# Patient Record
Sex: Male | Born: 1990 | Race: White | Hispanic: No | Marital: Single | Smoking: Current every day smoker
Health system: Southern US, Community
[De-identification: ages and names within clinical notes are randomized; demographics above are authoritative.]

## PROBLEM LIST (undated history)

## (undated) DIAGNOSIS — K219 Gastro-esophageal reflux disease without esophagitis: Secondary | ICD-10-CM

## (undated) DIAGNOSIS — I10 Essential (primary) hypertension: Secondary | ICD-10-CM

## (undated) DIAGNOSIS — Z87442 Personal history of urinary calculi: Secondary | ICD-10-CM

## (undated) HISTORY — PX: APPENDECTOMY: SHX54

---
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
## 2009-02-19 ENCOUNTER — Emergency Department (HOSPITAL_COMMUNITY): Admission: EM | Admit: 2009-02-19 | Discharge: 2009-02-19 | Payer: Self-pay | Admitting: Emergency Medicine

## 2009-05-16 ENCOUNTER — Emergency Department (HOSPITAL_COMMUNITY): Admission: EM | Admit: 2009-05-16 | Discharge: 2009-05-16 | Payer: Self-pay | Admitting: Emergency Medicine

## 2010-12-02 ENCOUNTER — Emergency Department (HOSPITAL_COMMUNITY)
Admission: EM | Admit: 2010-12-02 | Discharge: 2010-12-02 | Disposition: A | Discharge status: Home / Self Care | Payer: Self-pay | Attending: Emergency Medicine | Admitting: Emergency Medicine

## 2010-12-02 ENCOUNTER — Emergency Department (HOSPITAL_COMMUNITY): Payer: Self-pay

## 2010-12-02 DIAGNOSIS — M79609 Pain in unspecified limb: Secondary | ICD-10-CM | POA: Insufficient documentation

## 2010-12-02 DIAGNOSIS — L84 Corns and callosities: Secondary | ICD-10-CM | POA: Insufficient documentation

## 2010-12-02 DIAGNOSIS — R197 Diarrhea, unspecified: Secondary | ICD-10-CM | POA: Insufficient documentation

## 2010-12-02 DIAGNOSIS — E669 Obesity, unspecified: Secondary | ICD-10-CM | POA: Insufficient documentation

## 2010-12-02 DIAGNOSIS — J45909 Unspecified asthma, uncomplicated: Secondary | ICD-10-CM | POA: Insufficient documentation

## 2010-12-02 DIAGNOSIS — R112 Nausea with vomiting, unspecified: Secondary | ICD-10-CM | POA: Insufficient documentation

## 2010-12-02 DIAGNOSIS — M722 Plantar fascial fibromatosis: Secondary | ICD-10-CM | POA: Insufficient documentation

## 2010-12-02 LAB — URINALYSIS, ROUTINE W REFLEX MICROSCOPIC
Bilirubin Urine: NEGATIVE
Hgb urine dipstick: NEGATIVE
Ketones, ur: NEGATIVE mg/dL
Protein, ur: NEGATIVE mg/dL
Urobilinogen, UA: 0.2 mg/dL (ref 0.0–1.0)

## 2010-12-03 LAB — DIFFERENTIAL
Basophils Absolute: 0 10*3/uL (ref 0.0–0.1)
Basophils Relative: 0 % (ref 0–1)
Eosinophils Absolute: 0.2 10*3/uL (ref 0.0–0.7)
Monocytes Absolute: 1.5 10*3/uL — ABNORMAL HIGH (ref 0.1–1.0)
Neutro Abs: 6.9 10*3/uL (ref 1.7–7.7)
Neutrophils Relative %: 55 % (ref 43–77)

## 2010-12-03 LAB — BASIC METABOLIC PANEL
CO2: 26 mEq/L (ref 19–32)
Calcium: 9.1 mg/dL (ref 8.4–10.5)
Creatinine, Ser: 0.83 mg/dL (ref 0.4–1.5)
Glucose, Bld: 99 mg/dL (ref 70–99)

## 2010-12-03 LAB — CBC
MCHC: 35.5 g/dL (ref 30.0–36.0)
MCV: 89.7 fL (ref 78.0–100.0)
RDW: 12.5 % (ref 11.5–15.5)

## 2010-12-03 LAB — POCT CARDIAC MARKERS
CKMB, poc: <1 ng/mL — ABNORMAL LOW (ref 1.0–8.0)
Myoglobin, poc: 33.6 ng/mL (ref 12–200)

## 2012-01-22 ENCOUNTER — Emergency Department (HOSPITAL_COMMUNITY)
Admission: EM | Admit: 2012-01-22 | Discharge: 2012-01-22 | Disposition: A | Discharge status: Home / Self Care | Payer: Self-pay | Attending: Emergency Medicine | Admitting: Emergency Medicine

## 2012-01-22 ENCOUNTER — Encounter (HOSPITAL_COMMUNITY): Payer: Self-pay | Admitting: Emergency Medicine

## 2012-01-22 DIAGNOSIS — K0889 Other specified disorders of teeth and supporting structures: Secondary | ICD-10-CM

## 2012-01-22 DIAGNOSIS — K029 Dental caries, unspecified: Secondary | ICD-10-CM | POA: Insufficient documentation

## 2012-01-22 DIAGNOSIS — F17200 Nicotine dependence, unspecified, uncomplicated: Secondary | ICD-10-CM | POA: Insufficient documentation

## 2012-01-22 DIAGNOSIS — K089 Disorder of teeth and supporting structures, unspecified: Secondary | ICD-10-CM | POA: Insufficient documentation

## 2012-01-22 MED ORDER — HYDROCODONE-ACETAMINOPHEN 5-325 MG PO TABS: 1.0000 | ORAL_TABLET | ORAL | Status: AC | PRN

## 2012-01-22 MED ORDER — ONDANSETRON HCL 4 MG PO TABS
4.0000 mg | ORAL_TABLET | Freq: Once | ORAL | Status: AC
  Administered 2012-01-22: 4 mg via ORAL
  Filled 2012-01-22: qty 1

## 2012-01-22 MED ORDER — MELOXICAM 7.5 MG PO TABS: ORAL_TABLET | ORAL | Status: DC

## 2012-01-22 MED ORDER — PENICILLIN V POTASSIUM 250 MG PO TABS
500.0000 mg | ORAL_TABLET | Freq: Once | ORAL | Status: AC
  Administered 2012-01-22: 500 mg via ORAL
  Filled 2012-01-22: qty 2

## 2012-01-22 MED ORDER — IBUPROFEN 800 MG PO TABS
800.0000 mg | ORAL_TABLET | Freq: Once | ORAL | Status: AC
  Administered 2012-01-22: 800 mg via ORAL
  Filled 2012-01-22: qty 1

## 2012-01-22 MED ORDER — PENICILLIN V POTASSIUM 500 MG PO TABS: ORAL_TABLET | ORAL | Status: DC

## 2012-01-22 NOTE — ED Notes (Signed)
Pt c/o rt upper mouth pain x 3 days.

## 2012-01-22 NOTE — ED Provider Notes (Signed)
Medical screening examination/treatment/procedure(s) were performed by non-physician practitioner and as supervising physician I was immediately available for consultation/collaboration.  Juliet Rude. Rubin Payor, MD 01/22/12 [phone]

## 2012-01-22 NOTE — ED Provider Notes (Signed)
History     CSN: [id number]  Arrival date & time 01/22/12  1017   None     Chief Complaint  Patient presents with  . Dental Pain    (Consider location/radiation/quality/duration/timing/severity/associated sxs/prior treatment) Patient is a 21 y.o. male presenting with tooth pain. The history is provided by the patient.  Dental PainThe primary symptoms include mouth pain. Primary symptoms do not include shortness of breath or cough. The symptoms began 3 to 5 days ago. The symptoms are worsening. The symptoms occur constantly.  Additional symptoms include: gum swelling and jaw pain. Additional symptoms do not include: nosebleeds. Medical issues include: smoking.    History reviewed. No pertinent past medical history.  Past Surgical History  Procedure Date  . Appendectomy     History reviewed. No pertinent family history.  History  Substance Use Topics  . Smoking status: Current Everyday Smoker    Types: Cigarettes  . Smokeless tobacco: Not on file  . Alcohol Use: No      Review of Systems  Constitutional: Negative for activity change.       All ROS Neg except as noted in HPI  HENT: Positive for dental problem. Negative for nosebleeds and neck pain.   Eyes: Negative for photophobia and discharge.  Respiratory: Negative for cough, shortness of breath and wheezing.   Cardiovascular: Negative for chest pain and palpitations.  Gastrointestinal: Negative for abdominal pain and blood in stool.  Genitourinary: Negative for dysuria, frequency and hematuria.  Musculoskeletal: Negative for back pain and arthralgias.  Skin: Negative.   Neurological: Negative for dizziness, seizures and speech difficulty.  Psychiatric/Behavioral: Negative for hallucinations and confusion.    Allergies  Review of patient's allergies indicates not on file.  Home Medications  No current outpatient prescriptions on file.  BP 135/84  Pulse 84  Temp(Src) 97.7 F (36.5 C) (Oral)  Resp 17  Ht  5\' 9"  (1.753 m)  Wt 220 lb (99.791 kg)  BMI 32.49 kg/m2  SpO2 100%  Physical Exam  Nursing note and vitals reviewed. Constitutional: He is oriented to person, place, and time. He appears well-developed and well-nourished.  Non-toxic appearance.  HENT:  Head: Normocephalic.  Right Ear: Tympanic membrane and external ear normal.  Left Ear: Tympanic membrane and external ear normal.  Mouth/Throat: Dental caries present.       Multiple dental caries upper and lower with evidence of gum disease.  Eyes: EOM and lids are normal. Pupils are equal, round, and reactive to light.  Neck: Normal range of motion. Neck supple. Carotid bruit is not present.  Cardiovascular: Normal rate, regular rhythm, normal heart sounds, intact distal pulses and normal pulses.   Pulmonary/Chest: Breath sounds normal. No respiratory distress.  Abdominal: Soft. Bowel sounds are normal. There is no tenderness. There is no guarding.  Musculoskeletal: Normal range of motion.  Lymphadenopathy:       Head (right side): No submandibular adenopathy present.       Head (left side): No submandibular adenopathy present.    He has no cervical adenopathy.  Neurological: He is alert and oriented to person, place, and time. He has normal strength. No cranial nerve deficit or sensory deficit.  Skin: Skin is warm and dry.  Psychiatric: He has a normal mood and affect. His speech is normal.    ED Course  Procedures (including critical care time)  Labs Reviewed - No data to display No results found.   No diagnosis found.    MDM  I have reviewed nursing  notes, vital signs, and all appropriate lab and imaging results for this patient. Pt is [facility]. Waiting for paper work to clear.       Kathie Dike, [place] 01/22/12 1131

## 2012-01-22 NOTE — Discharge Instructions (Signed)
Dental Pain  A tooth ache may be caused by cavities (tooth decay). Cavities expose the nerve of the tooth to air and hot or cold temperatures. It may come from an infection or abscess (also called a boil or furuncle) around your tooth. It is also often caused by dental caries (tooth decay). This causes the pain you are having.  DIAGNOSIS   Your caregiver can diagnose this problem by exam.  TREATMENT   · If caused by an infection, it may be treated with medications which kill germs (antibiotics) and pain medications as prescribed by your caregiver. Take medications as directed.  · Only take over-the-counter or prescription medicines for pain, discomfort, or fever as directed by your caregiver.  · Whether the tooth ache today is caused by infection or dental disease, you should see your dentist as soon as possible for further care.  SEEK MEDICAL CARE IF:  The exam and treatment you received today has been provided on an emergency basis only. This is not a substitute for complete medical or dental care. If your problem worsens or new problems (symptoms) appear, and you are unable to meet with your dentist, call or return to this location.  SEEK IMMEDIATE MEDICAL CARE IF:   · You have a fever.  · You develop redness and swelling of your face, jaw, or neck.  · You are unable to open your mouth.  · You have severe pain uncontrolled by pain medicine.  MAKE SURE YOU:   · Understand these instructions.  · Will watch your condition.  · Will get help right away if you are not doing well or get worse.  Document Released: 08/12/2005 Document Revised: 08/01/2011 Document Reviewed: 03/30/2008  ExitCare® Patient Information ©2012 [employer].

## 2013-11-29 ENCOUNTER — Encounter (HOSPITAL_COMMUNITY): Payer: Self-pay | Admitting: Emergency Medicine

## 2013-11-29 ENCOUNTER — Emergency Department (HOSPITAL_COMMUNITY)
Admission: EM | Admit: 2013-11-29 | Discharge: 2013-11-29 | Disposition: A | Discharge status: Home / Self Care | Payer: Medicaid Other | Attending: Emergency Medicine | Admitting: Emergency Medicine

## 2013-11-29 DIAGNOSIS — M5430 Sciatica, unspecified side: Secondary | ICD-10-CM | POA: Insufficient documentation

## 2013-11-29 DIAGNOSIS — F17200 Nicotine dependence, unspecified, uncomplicated: Secondary | ICD-10-CM | POA: Insufficient documentation

## 2013-11-29 DIAGNOSIS — M549 Dorsalgia, unspecified: Secondary | ICD-10-CM | POA: Diagnosis present

## 2013-11-29 DIAGNOSIS — G8929 Other chronic pain: Secondary | ICD-10-CM | POA: Diagnosis not present

## 2013-11-29 MED ORDER — HYDROMORPHONE HCL PF 1 MG/ML IJ SOLN
1.0000 mg | Freq: Once | INTRAMUSCULAR | Status: AC
  Administered 2013-11-29: 1 mg via INTRAMUSCULAR
  Filled 2013-11-29: qty 1

## 2013-11-29 MED ORDER — OXYCODONE-ACETAMINOPHEN 5-325 MG PO TABS
1.0000 | ORAL_TABLET | Freq: Once | ORAL | Status: AC
  Administered 2013-11-29: 2 via ORAL
  Filled 2013-11-29: qty 2

## 2013-11-29 MED ORDER — OXYCODONE-ACETAMINOPHEN 10-325 MG PO TABS: 1.0000 | ORAL_TABLET | Freq: Four times a day (QID) | ORAL | Status: DC | PRN

## 2013-11-29 MED ORDER — PREDNISONE 10 MG PO TABS: 20.0000 mg | ORAL_TABLET | Freq: Every day | ORAL | Status: DC

## 2013-11-29 NOTE — ED Notes (Signed)
Measured urine in patients bladder with bladder scanner. 32ml residual left.

## 2013-11-29 NOTE — ED Notes (Signed)
Called ED case manager for patient assistance with follow up care.

## 2013-11-29 NOTE — ED Provider Notes (Signed)
CSN: [id number]     Arrival date & time 11/29/13  1305 History  This chart was scribed for non-physician practitioner, Felicie Morn, NP-C working with Gavin Pound. Oletta Lamas, MD by Luisa Dago, ED scribe. This patient was seen in room TR04C/TR04C and the patient's care was started at 4:28 PM.    Chief Complaint  Patient presents with  . Back Pain   The history is provided by the patient and a parent. No language interpreter was used.   HPI Comments: Daniel Stark is a 23 y.o. male who presents to the Emergency Department complaining of chronic back pain since October. Pt states that he has a history of a ruptured disk. He states that his last MRI was done at [facility]. Mother states that the pt has been seen by Dr. Jordan Likes who told the pt that he needs back surgery in order to alleviate his symptoms. However, pt states that due to insurance lapse he is unable to undergo the surgery. He is currently trying to apply for disability but he was told that he does not qualify. Mother states that pt is currently out of a job because of his condition, he is also unable to drive. Pt states that at home he was to put pillows on the chair to make sitting more comfortable. She reports that the pt has had one episode of bladder incontinence. Pt was seen by a chiropractor for 8 weeks with minimal to no relief. Pt Denies any numbness, abdominal pain, nausea, emesis,     History reviewed. No pertinent past medical history. Past Surgical History  Procedure Laterality Date  . Appendectomy     History reviewed. No pertinent family history. History  Substance Use Topics  . Smoking status: Current Every Day Smoker    Types: Cigarettes  . Smokeless tobacco: Not on file  . Alcohol Use: No    Review of Systems  Constitutional: Negative for fever, chills and diaphoresis.  HENT: Negative for congestion.   Respiratory: Negative for cough and shortness of breath.   Cardiovascular: Negative for chest pain.   Gastrointestinal: Negative for nausea, vomiting and abdominal pain.  Musculoskeletal: Positive for back pain.  Neurological: Negative for numbness.  All other systems reviewed and are negative.   Allergies  Review of patient's allergies indicates no known allergies.  Home Medications   Current Outpatient Rx  Name  Route  Sig  Dispense  Refill  . ibuprofen (ADVIL,MOTRIN) 200 MG tablet   Oral   Take 800 mg by mouth every 6 (six) hours as needed. For dental pain         . oxyCODONE-acetaminophen (PERCOCET) 10-325 MG per tablet   Oral   Take 1 tablet by mouth every 4 (four) hours as needed for pain.          BP 133/84  Pulse 80  Temp(Src) 97.9 F (36.6 C) (Oral)  Resp 20  Ht 5\' 9"  (1.753 m)  Wt 250 lb (113.399 kg)  BMI 36.90 kg/m2  SpO2 98%  Physical Exam  Nursing note and vitals reviewed. Constitutional: He is oriented to person, place, and time. He appears well-developed and well-nourished. No distress.  HENT:  Head: Normocephalic and atraumatic.  Eyes: EOM are normal.  Neck: Neck supple. No tracheal deviation present.  Cardiovascular: Normal rate.   Pulmonary/Chest: Effort normal. No respiratory distress.  Musculoskeletal: Normal range of motion. He exhibits tenderness.  Tenderness to Midpoint of thoracic spine to the lower back.  Neurological: He is alert and  oriented to person, place, and time. He displays abnormal reflex. Coordination normal.  Reflex Scores:      Patellar reflexes are 1+ on the right side and 1+ on the left side. Weakness to lower extremities. Right greater than left.   Skin: Skin is warm and dry.  Psychiatric: He has a normal mood and affect. His behavior is normal.    ED Course  Procedures (including critical care time)  DIAGNOSTIC STUDIES: Oxygen Saturation is 98% on RA, normal by my interpretation.    COORDINATION OF CARE: 4:38 PM-Discussed treatment plan which includes review of pt MRI and prescription of pain medication with pt  at bedside and pt agreed to plan.   Labs Review Labs Reviewed - No data to display Imaging Review No results found.   EKG Interpretation None     MRI images reviewed.   Good anal sphincter tone.  Doubt cauda equina. Add short course of steroid to pain management. Case manager requested to assist patient find a provider for ongoing pain management. Return precautions discussed. MDM   Final diagnoses:  None    Chronic back pain.  Herniated disk L4/5.  I personally performed the services described in this documentation, which was scribed in my presence. The recorded information has been reviewed and is accurate.  Jimmye Normanavid John Goel, NP 11/30/13 [phone]

## 2013-11-29 NOTE — ED Notes (Signed)
Patient given urnal with instructions.

## 2013-11-29 NOTE — ED Notes (Signed)
Left information for case manager to follow up with patient tomorrow.

## 2013-11-29 NOTE — ED Notes (Addendum)
Back pain since since oct  Has had MRI and seen [facility] has no insurance  Need pain meds, last  rx was 1 week ago told dr all issues last week, wants surgery

## 2013-11-29 NOTE — Progress Notes (Signed)
   CARE MANAGEMENT ED NOTE 11/29/2013  Patient:  Daniel Stark,Daniel Stark   Account Number:  [id number]  Date Initiated:  11/29/2013  Documentation initiated by:  Radford PaxFERRERO,Maricella Filyaw  Subjective/Objective Assessment:   patient presents to Ed with chronic back pain     Subjective/Objective Assessment Detail:     Action/Plan:   Action/Plan Detail:   Anticipated DC Date:       Status Recommendation to Physician:   Result of Recommendation:    Other ED Services  Consult Working Plan    DC Planning Services  Other  PCP issues    Choice offered to / List presented to:            Status of service:  Completed, signed off  ED Comments:   ED Comments Detail:  [facility] regarding physician follow up for back pain at 2014pm. [facility] returned phone call at 2050pm. Awaiting call back.

## 2013-12-01 NOTE — Progress Notes (Signed)
Late entry for 12/01/13 1450 [facility] ED AM CM received a call from [facility] office when pt called [facility]. CM spoke with pt who [facility] and states he was awaiting a f/u call for pcp appointment CM reviewed EPIC to see pt address [place] in [place] Cm inquired if [place] services [place] and pt stated "no"  [place]. Pt reports having made attempts to reach [facility] at [address] without response. CM discussed with him that she would make an attempt to reach staff at wellness center and return a call to him. CM verified contact information in EPIC. Correct home & mobile number is [phone] CM consulted with [facility] staff who confirms no [facility] program [place] [facility] [place] [facility] Cm sent referral email to [employer]com Levels[employer] Rivera at [facility] to f/u on 12/02/13 with pt and [facility]

## 2013-12-02 NOTE — ED Provider Notes (Signed)
Medical screening examination/treatment/procedure(s) were performed by non-physician practitioner and as supervising physician I was immediately available for consultation/collaboration.   Hennesy Sobalvarro Y. Pharaoh Pio, MD 12/02/13 1530 

## 2013-12-02 NOTE — Progress Notes (Signed)
12/02/13 Clinton GallantKimberly L Terance Pomplun, RN, CCM [phone] ED Cm received a voice message from [facility] C[facility], Aundra MilletMegan that pt called [facility] office about f/u appointment.  No return email from [employer] Rivera.  No calls from Wellness center 1253 ED CM left a voice message at [phone] to request assist with appointment and return call. 1258 CM spoke with pt and allowed him opportunity to ventilate his feelings and ask questions about what he could do to get the assist he wants related to MD care and disability process. Informed him that Cm has not received return call from Wellness center for his appointment. Reports he has applied for medicaid once & denied. Reports he is trying to appeal.  Aware he needs to return to apply at DSS. Cm discussed offered to call other self pay [facility] providers but pt states he does not have $50 co pay CM reviewed possible assist from local churches. Has been seen by Dr Jordan LikesPool, Dr Barth KirksMichaels, orthopedic provider, neurosurgery States informed by Neurosurgery they are unable to provide service until pt has funding even after speaking with finance/billing departments of these offices. Reports he has made attempt to apply for affordable care act in person but told he did not qualify ("don't make $11000 a year. I have not worked since last year" and medicaid would be of better assistance. Pt tearful during his conversation with ED CM.  Reports he has a "learning disability" and has his mother to also help him. Reviewed again with pt that he is not eligible for "[place] card" [place]. Pt states he has consulted with a lawyer and informed he has to obtain a pcp to have records to assist with his claim. Pt states difficulty with transportation also.  CM provided pt with [facility] [address] [place] [phone] to attempt to get pt to pcp services closer to [place] although he states he feels he receives better care in [place] when  he can get someone to drive him. Pt reports to be doing his best to navigate through his issues Cm encouraged and empathized with him. Cm also discuss his meed to contact the facilities [facility], MRI center, Dr Jordan LikesPool, Dr Barth KirksMichaels) he has received care from since injury to obtain medical records Pt provided with [facility] medical record request # [id number] Pt offered Cm office number CM encouraged pt to attempt to be seen [facility] during walk in times of monday-thursdays 9-10:30 am He stated he would make attempt. Pt appreciative of Cm for allowing time for him to ventilate his feelings and "speaking with me"

## 2014-03-31 ENCOUNTER — Other Ambulatory Visit: Payer: Self-pay | Admitting: Neurosurgery

## 2014-04-07 ENCOUNTER — Encounter (HOSPITAL_COMMUNITY): Payer: Self-pay | Admitting: Pharmacy Technician

## 2014-04-09 NOTE — Pre-Procedure Instructions (Addendum)
Webb SilversmithRobert W Batchelder  04/09/2014   Your procedure is scheduled on:  August 25  [facility] at 08:00 AM.  Call this number if you have problems the morning of surgery: [phone]   Remember:   Do not eat food or drink liquids after midnight.   Take these medicines the morning of surgery with A SIP OF WATER: Percocet (if needed)   STOP/ Do not take Aspirin, Aleve, Naproxen, Advil, Ibuprofen, Motrin, Vitamins, Herbs, or Supplements starting today   Do not wear jewelry, make-up or nail polish.  Do not wear lotions, powders, or perfumes. You may wear deodorant.  Do not shave 48 hours prior to surgery. Men may shave face and neck.  Do not bring valuables to the hospital.  [facility] is not responsible for any belongings or valuables.               Contacts, dentures or bridgework may not be worn into surgery.  Leave suitcase in the car. After surgery it may be brought to your room.  For patients admitted to the hospital, discharge time is determined by your treatment team.               Patients discharged the day of surgery will not be allowed to drive home.  Name and phone number of your driver: Family/ Friend  Special Instructions: See [facility] Preparing For Surgery   Please read over the following fact sheets that you were given: Pain Booklet, Coughing and Deep Breathing and Surgical Site Infection Prevention

## 2014-04-11 ENCOUNTER — Encounter (HOSPITAL_COMMUNITY): Payer: Self-pay

## 2014-04-11 ENCOUNTER — Encounter (HOSPITAL_COMMUNITY)
Admission: RE | Admit: 2014-04-11 | Discharge: 2014-04-11 | Disposition: A | Discharge status: Home / Self Care | Payer: Medicaid Other | Source: Ambulatory Visit | Attending: Neurosurgery | Admitting: Neurosurgery

## 2014-04-11 ENCOUNTER — Other Ambulatory Visit (HOSPITAL_COMMUNITY): Payer: Medicaid Other

## 2014-04-11 DIAGNOSIS — Z01818 Encounter for other preprocedural examination: Secondary | ICD-10-CM | POA: Insufficient documentation

## 2014-04-11 DIAGNOSIS — M5126 Other intervertebral disc displacement, lumbar region: Secondary | ICD-10-CM | POA: Insufficient documentation

## 2014-04-11 DIAGNOSIS — E663 Overweight: Secondary | ICD-10-CM | POA: Insufficient documentation

## 2014-04-11 DIAGNOSIS — I10 Essential (primary) hypertension: Secondary | ICD-10-CM | POA: Diagnosis not present

## 2014-04-11 HISTORY — DX: Gastro-esophageal reflux disease without esophagitis: K21.9

## 2014-04-11 HISTORY — DX: Personal history of urinary calculi: Z87.442

## 2014-04-11 HISTORY — DX: Essential (primary) hypertension: I10

## 2014-04-11 LAB — BASIC METABOLIC PANEL
Anion gap: 14 (ref 5–15)
BUN: 9 mg/dL (ref 6–23)
CO2: 25 meq/L (ref 19–32)
Calcium: 9.5 mg/dL (ref 8.4–10.5)
Chloride: 100 mEq/L (ref 96–112)
Creatinine, Ser: 0.79 mg/dL (ref 0.50–1.35)
GFR calc Af Amer: >90 mL/min (ref >90)
Glucose, Bld: 95 mg/dL (ref 70–99)
POTASSIUM: 3.7 meq/L (ref 3.7–5.3)
SODIUM: 139 meq/L (ref 137–147)

## 2014-04-11 LAB — CBC WITH DIFFERENTIAL/PLATELET
Basophils Absolute: 0 10*3/uL (ref 0.0–0.1)
Basophils Relative: 0 % (ref 0–1)
EOS ABS: 0.3 10*3/uL (ref 0.0–0.7)
EOS PCT: 3 % (ref 0–5)
HCT: 39.7 % (ref 39.0–52.0)
Hemoglobin: 13.8 g/dL (ref 13.0–17.0)
Lymphocytes Relative: 30 % (ref 12–46)
Lymphs Abs: 2.8 10*3/uL (ref 0.7–4.0)
MCH: 31.2 pg (ref 26.0–34.0)
MCHC: 34.8 g/dL (ref 30.0–36.0)
MCV: 89.8 fL (ref 78.0–100.0)
MONOS PCT: 11 % (ref 3–12)
Monocytes Absolute: 1 10*3/uL (ref 0.1–1.0)
Neutro Abs: 5.3 10*3/uL (ref 1.7–7.7)
Neutrophils Relative %: 56 % (ref 43–77)
PLATELETS: 298 10*3/uL (ref 150–400)
RBC: 4.42 MIL/uL (ref 4.22–5.81)
RDW: 12.2 % (ref 11.5–15.5)
WBC: 9.4 10*3/uL (ref 4.0–10.5)

## 2014-04-11 LAB — SURGICAL PCR SCREEN
MRSA, PCR: NEGATIVE
Staphylococcus aureus: NEGATIVE

## 2014-04-18 MED ORDER — DEXTROSE 5 % IV SOLN
3.0000 g | INTRAVENOUS | Status: AC
  Administered 2014-04-19: 3 g via INTRAVENOUS
  Filled 2014-04-18: qty 3000

## 2014-04-18 MED ORDER — CEFAZOLIN SODIUM-DEXTROSE 2-3 GM-% IV SOLR: 2.0000 g | INTRAVENOUS | Status: DC

## 2014-04-18 MED ORDER — DEXAMETHASONE SODIUM PHOSPHATE 10 MG/ML IJ SOLN
10.0000 mg | INTRAMUSCULAR | Status: AC
  Administered 2014-04-19: 10 mg via INTRAVENOUS
  Filled 2014-04-18: qty 1

## 2014-04-19 ENCOUNTER — Ambulatory Visit (HOSPITAL_COMMUNITY): Payer: Medicaid Other

## 2014-04-19 ENCOUNTER — Ambulatory Visit (HOSPITAL_COMMUNITY): Payer: Medicaid Other | Admitting: Certified Registered Nurse Anesthetist

## 2014-04-19 ENCOUNTER — Encounter (HOSPITAL_COMMUNITY): Payer: Medicaid Other | Admitting: Certified Registered Nurse Anesthetist

## 2014-04-19 ENCOUNTER — Observation Stay (HOSPITAL_COMMUNITY)
Admission: RE | Admit: 2014-04-19 | Discharge: 2014-04-19 | Disposition: A | Discharge status: Home / Self Care | Payer: Medicaid Other | Source: Ambulatory Visit | Attending: Neurosurgery | Admitting: Neurosurgery

## 2014-04-19 ENCOUNTER — Encounter (HOSPITAL_COMMUNITY): Payer: Self-pay | Admitting: *Deleted

## 2014-04-19 ENCOUNTER — Encounter (HOSPITAL_COMMUNITY)
Admission: RE | Disposition: A | Discharge status: Home / Self Care | Payer: Self-pay | Source: Ambulatory Visit | Attending: Neurosurgery

## 2014-04-19 DIAGNOSIS — I10 Essential (primary) hypertension: Secondary | ICD-10-CM | POA: Insufficient documentation

## 2014-04-19 DIAGNOSIS — M5116 Intervertebral disc disorders with radiculopathy, lumbar region: Secondary | ICD-10-CM | POA: Diagnosis present

## 2014-04-19 DIAGNOSIS — F17200 Nicotine dependence, unspecified, uncomplicated: Secondary | ICD-10-CM | POA: Diagnosis not present

## 2014-04-19 DIAGNOSIS — M5126 Other intervertebral disc displacement, lumbar region: Secondary | ICD-10-CM | POA: Diagnosis present

## 2014-04-19 HISTORY — PX: LUMBAR LAMINECTOMY/DECOMPRESSION MICRODISCECTOMY: SHX5026

## 2014-04-19 SURGERY — LUMBAR LAMINECTOMY/DECOMPRESSION MICRODISCECTOMY 1 LEVEL
Anesthesia: General | Site: Back | Laterality: Left

## 2014-04-19 MED ORDER — ONDANSETRON HCL 4 MG/2ML IJ SOLN
INTRAMUSCULAR | Status: AC
  Filled 2014-04-19: qty 2

## 2014-04-19 MED ORDER — NEOSTIGMINE METHYLSULFATE 10 MG/10ML IV SOLN
INTRAVENOUS | Status: DC | PRN
  Administered 2014-04-19: 4 mg via INTRAVENOUS

## 2014-04-19 MED ORDER — THROMBIN 5000 UNITS EX SOLR
CUTANEOUS | Status: DC | PRN
  Administered 2014-04-19 (×2): 5000 [IU] via TOPICAL

## 2014-04-19 MED ORDER — SODIUM CHLORIDE 0.9 % IV SOLN: 250.0000 mL | INTRAVENOUS | Status: DC

## 2014-04-19 MED ORDER — BUPIVACAINE HCL (PF) 0.25 % IJ SOLN
INTRAMUSCULAR | Status: DC | PRN
  Administered 2014-04-19: 20 mL

## 2014-04-19 MED ORDER — HEMOSTATIC AGENTS (NO CHARGE) OPTIME
TOPICAL | Status: DC | PRN
  Administered 2014-04-19: 1 via TOPICAL

## 2014-04-19 MED ORDER — ROCURONIUM BROMIDE 50 MG/5ML IV SOLN
INTRAVENOUS | Status: AC
  Filled 2014-04-19: qty 1

## 2014-04-19 MED ORDER — ALUM & MAG HYDROXIDE-SIMETH 200-200-20 MG/5ML PO SUSP: 30.0000 mL | Freq: Four times a day (QID) | ORAL | Status: DC | PRN

## 2014-04-19 MED ORDER — LACTATED RINGERS IV SOLN
INTRAVENOUS | Status: DC | PRN
  Administered 2014-04-19 (×2): via INTRAVENOUS

## 2014-04-19 MED ORDER — PROMETHAZINE HCL 25 MG/ML IJ SOLN: 6.2500 mg | INTRAMUSCULAR | Status: DC | PRN

## 2014-04-19 MED ORDER — ACETAMINOPHEN 325 MG PO TABS: 650.0000 mg | ORAL_TABLET | ORAL | Status: DC | PRN

## 2014-04-19 MED ORDER — OXYCODONE-ACETAMINOPHEN 10-325 MG PO TABS: 1.0000 | ORAL_TABLET | ORAL | Status: AC | PRN

## 2014-04-19 MED ORDER — OXYCODONE HCL 5 MG PO TABS
ORAL_TABLET | ORAL | Status: AC
  Filled 2014-04-19: qty 1

## 2014-04-19 MED ORDER — FENTANYL CITRATE 0.05 MG/ML IJ SOLN
INTRAMUSCULAR | Status: AC
  Filled 2014-04-19: qty 5

## 2014-04-19 MED ORDER — KETOROLAC TROMETHAMINE 30 MG/ML IJ SOLN
INTRAMUSCULAR | Status: DC | PRN
  Administered 2014-04-19: 30 mg via INTRAVENOUS

## 2014-04-19 MED ORDER — OXYCODONE HCL 5 MG/5ML PO SOLN: 5.0000 mg | Freq: Once | ORAL | Status: AC | PRN

## 2014-04-19 MED ORDER — 0.9 % SODIUM CHLORIDE (POUR BTL) OPTIME
TOPICAL | Status: DC | PRN
  Administered 2014-04-19: 1000 mL

## 2014-04-19 MED ORDER — ALBUTEROL SULFATE HFA 108 (90 BASE) MCG/ACT IN AERS
INHALATION_SPRAY | RESPIRATORY_TRACT | Status: DC | PRN
  Administered 2014-04-19: 2 via RESPIRATORY_TRACT

## 2014-04-19 MED ORDER — ACETAMINOPHEN 650 MG RE SUPP: 650.0000 mg | RECTAL | Status: DC | PRN

## 2014-04-19 MED ORDER — GLYCOPYRROLATE 0.2 MG/ML IJ SOLN
INTRAMUSCULAR | Status: AC
  Filled 2014-04-19: qty 5

## 2014-04-19 MED ORDER — LACTATED RINGERS IV SOLN
INTRAVENOUS | Status: DC
  Administered 2014-04-19: 09:00:00 via INTRAVENOUS

## 2014-04-19 MED ORDER — NEOSTIGMINE METHYLSULFATE 10 MG/10ML IV SOLN
INTRAVENOUS | Status: AC
  Filled 2014-04-19: qty 2

## 2014-04-19 MED ORDER — KETOROLAC TROMETHAMINE 30 MG/ML IJ SOLN
INTRAMUSCULAR | Status: AC
  Filled 2014-04-19: qty 1

## 2014-04-19 MED ORDER — PHENYLEPHRINE HCL 10 MG/ML IJ SOLN
INTRAMUSCULAR | Status: DC | PRN
  Administered 2014-04-19: 80 ug via INTRAVENOUS

## 2014-04-19 MED ORDER — MIDAZOLAM HCL 2 MG/2ML IJ SOLN
INTRAMUSCULAR | Status: AC
  Filled 2014-04-19: qty 2

## 2014-04-19 MED ORDER — LIDOCAINE HCL (CARDIAC) 20 MG/ML IV SOLN
INTRAVENOUS | Status: AC
  Filled 2014-04-19: qty 5

## 2014-04-19 MED ORDER — SODIUM CHLORIDE 0.9 % IR SOLN
Status: DC | PRN
  Administered 2014-04-19: 10:00:00

## 2014-04-19 MED ORDER — PROPOFOL 10 MG/ML IV BOLUS
INTRAVENOUS | Status: AC
Start: 2014-04-19 — End: 2014-04-19
  Filled 2014-04-19: qty 20

## 2014-04-19 MED ORDER — PROPOFOL 10 MG/ML IV BOLUS
INTRAVENOUS | Status: AC
  Filled 2014-04-19: qty 20

## 2014-04-19 MED ORDER — ONDANSETRON HCL 4 MG/2ML IJ SOLN: 4.0000 mg | INTRAMUSCULAR | Status: DC | PRN

## 2014-04-19 MED ORDER — SODIUM CHLORIDE 0.9 % IJ SOLN: 3.0000 mL | Freq: Two times a day (BID) | INTRAMUSCULAR | Status: DC

## 2014-04-19 MED ORDER — CYCLOBENZAPRINE HCL 10 MG PO TABS
ORAL_TABLET | ORAL | Status: AC
  Administered 2014-04-19: 10 mg
  Filled 2014-04-19: qty 1

## 2014-04-19 MED ORDER — GLYCOPYRROLATE 0.2 MG/ML IJ SOLN
INTRAMUSCULAR | Status: DC | PRN
  Administered 2014-04-19: .8 mg via INTRAVENOUS

## 2014-04-19 MED ORDER — SENNA 8.6 MG PO TABS
1.0000 | ORAL_TABLET | Freq: Two times a day (BID) | ORAL | Status: DC
  Administered 2014-04-19: 8.6 mg via ORAL
  Filled 2014-04-19: qty 1

## 2014-04-19 MED ORDER — MENTHOL 3 MG MT LOZG: 1.0000 | LOZENGE | OROMUCOSAL | Status: DC | PRN

## 2014-04-19 MED ORDER — CYCLOBENZAPRINE HCL 10 MG PO TABS: 10.0000 mg | ORAL_TABLET | Freq: Three times a day (TID) | ORAL | Status: DC | PRN

## 2014-04-19 MED ORDER — HYDROCODONE-ACETAMINOPHEN 5-325 MG PO TABS: 1.0000 | ORAL_TABLET | ORAL | Status: DC | PRN

## 2014-04-19 MED ORDER — ROCURONIUM BROMIDE 50 MG/5ML IV SOLN
INTRAVENOUS | Status: AC
  Filled 2014-04-19: qty 2

## 2014-04-19 MED ORDER — SODIUM CHLORIDE 0.9 % IJ SOLN: 3.0000 mL | INTRAMUSCULAR | Status: DC | PRN

## 2014-04-19 MED ORDER — KETOROLAC TROMETHAMINE 30 MG/ML IJ SOLN: 30.0000 mg | Freq: Four times a day (QID) | INTRAMUSCULAR | Status: DC

## 2014-04-19 MED ORDER — OXYCODONE HCL 5 MG PO TABS
5.0000 mg | ORAL_TABLET | Freq: Once | ORAL | Status: AC | PRN
  Administered 2014-04-19: 5 mg via ORAL

## 2014-04-19 MED ORDER — PHENOL 1.4 % MT LIQD: 1.0000 | OROMUCOSAL | Status: DC | PRN

## 2014-04-19 MED ORDER — ARTIFICIAL TEARS OP OINT
TOPICAL_OINTMENT | OPHTHALMIC | Status: DC | PRN
  Administered 2014-04-19: 1 via OPHTHALMIC

## 2014-04-19 MED ORDER — ALBUTEROL SULFATE HFA 108 (90 BASE) MCG/ACT IN AERS
INHALATION_SPRAY | RESPIRATORY_TRACT | Status: AC
  Filled 2014-04-19: qty 6.7

## 2014-04-19 MED ORDER — HYDROMORPHONE HCL PF 1 MG/ML IJ SOLN
0.2500 mg | INTRAMUSCULAR | Status: DC | PRN
  Administered 2014-04-19 (×2): 0.5 mg via INTRAVENOUS

## 2014-04-19 MED ORDER — ROCURONIUM BROMIDE 100 MG/10ML IV SOLN
INTRAVENOUS | Status: DC | PRN
  Administered 2014-04-19: 50 mg via INTRAVENOUS

## 2014-04-19 MED ORDER — OXYCODONE-ACETAMINOPHEN 10-325 MG PO TABS: 1.0000 | ORAL_TABLET | ORAL | Status: DC | PRN

## 2014-04-19 MED ORDER — CEFAZOLIN SODIUM 1-5 GM-% IV SOLN
1.0000 g | Freq: Three times a day (TID) | INTRAVENOUS | Status: DC
  Filled 2014-04-19: qty 50

## 2014-04-19 MED ORDER — PROPOFOL 10 MG/ML IV BOLUS
INTRAVENOUS | Status: DC | PRN
  Administered 2014-04-19: 200 mg via INTRAVENOUS

## 2014-04-19 MED ORDER — ONDANSETRON HCL 4 MG/2ML IJ SOLN
INTRAMUSCULAR | Status: DC | PRN
  Administered 2014-04-19: 4 mg via INTRAVENOUS

## 2014-04-19 MED ORDER — LIDOCAINE HCL (CARDIAC) 20 MG/ML IV SOLN
INTRAVENOUS | Status: DC | PRN
  Administered 2014-04-19: 100 mg via INTRAVENOUS

## 2014-04-19 MED ORDER — OXYCODONE-ACETAMINOPHEN 5-325 MG PO TABS
1.0000 | ORAL_TABLET | ORAL | Status: DC | PRN
  Administered 2014-04-19: 2 via ORAL
  Filled 2014-04-19: qty 2

## 2014-04-19 MED ORDER — HYDROMORPHONE HCL PF 1 MG/ML IJ SOLN
0.5000 mg | INTRAMUSCULAR | Status: DC | PRN
Start: 2014-04-19 — End: 2014-04-19

## 2014-04-19 MED ORDER — CYCLOBENZAPRINE HCL 10 MG PO TABS: 10.0000 mg | ORAL_TABLET | Freq: Three times a day (TID) | ORAL | Status: AC | PRN

## 2014-04-19 MED ORDER — HYDROMORPHONE HCL PF 1 MG/ML IJ SOLN
INTRAMUSCULAR | Status: AC
  Filled 2014-04-19: qty 2

## 2014-04-19 SURGICAL SUPPLY — 54 items
BAG DECANTER FOR FLEXI CONT (MISCELLANEOUS) ×3 IMPLANT
BENZOIN TINCTURE PRP APPL 2/3 (GAUZE/BANDAGES/DRESSINGS) ×3 IMPLANT
BLADE SURG ROTATE 9660 (MISCELLANEOUS) IMPLANT
BRUSH SCRUB EZ PLAIN DRY (MISCELLANEOUS) ×3 IMPLANT
BUR CUTTER 7.0 ROUND (BURR) ×3 IMPLANT
CANISTER SUCT 3000ML (MISCELLANEOUS) ×3 IMPLANT
CLOSURE WOUND 1/2 X4 (GAUZE/BANDAGES/DRESSINGS) ×1
CONT SPEC 4OZ CLIKSEAL STRL BL (MISCELLANEOUS) ×3 IMPLANT
DECANTER SPIKE VIAL GLASS SM (MISCELLANEOUS) ×3 IMPLANT
DERMABOND ADHESIVE PROPEN (GAUZE/BANDAGES/DRESSINGS) ×2
DERMABOND ADVANCED (GAUZE/BANDAGES/DRESSINGS) ×2
DERMABOND ADVANCED .7 DNX12 (GAUZE/BANDAGES/DRESSINGS) ×1 IMPLANT
DERMABOND ADVANCED .7 DNX6 (GAUZE/BANDAGES/DRESSINGS) ×1 IMPLANT
DRAPE LAPAROTOMY 100X72X124 (DRAPES) ×3 IMPLANT
DRAPE MICROSCOPE LEICA (MISCELLANEOUS) ×3 IMPLANT
DRAPE POUCH INSTRU U-SHP 10X18 (DRAPES) ×3 IMPLANT
DRAPE PROXIMA HALF (DRAPES) IMPLANT
DRAPE SURG 17X23 STRL (DRAPES) ×6 IMPLANT
DRSG OPSITE POSTOP 3X4 (GAUZE/BANDAGES/DRESSINGS) ×3 IMPLANT
DURAPREP 26ML APPLICATOR (WOUND CARE) ×3 IMPLANT
ELECT REM PT RETURN 9FT ADLT (ELECTROSURGICAL) ×3
ELECTRODE REM PT RTRN 9FT ADLT (ELECTROSURGICAL) ×1 IMPLANT
GAUZE SPONGE 4X4 12PLY STRL (GAUZE/BANDAGES/DRESSINGS) ×3 IMPLANT
GAUZE SPONGE 4X4 16PLY XRAY LF (GAUZE/BANDAGES/DRESSINGS) IMPLANT
GLOVE BIO SURGEON STRL SZ7 (GLOVE) ×6 IMPLANT
GLOVE BIOGEL PI IND STRL 7.0 (GLOVE) ×1 IMPLANT
GLOVE BIOGEL PI INDICATOR 7.0 (GLOVE) ×2
GLOVE ECLIPSE 6.5 STRL STRAW (GLOVE) ×3 IMPLANT
GLOVE ECLIPSE 9.0 STRL (GLOVE) ×6 IMPLANT
GLOVE EXAM NITRILE LRG STRL (GLOVE) IMPLANT
GLOVE EXAM NITRILE MD LF STRL (GLOVE) IMPLANT
GLOVE EXAM NITRILE XL STR (GLOVE) IMPLANT
GLOVE EXAM NITRILE XS STR PU (GLOVE) IMPLANT
GOWN STRL REUS W/ TWL LRG LVL3 (GOWN DISPOSABLE) IMPLANT
GOWN STRL REUS W/ TWL XL LVL3 (GOWN DISPOSABLE) ×4 IMPLANT
GOWN STRL REUS W/TWL 2XL LVL3 (GOWN DISPOSABLE) IMPLANT
GOWN STRL REUS W/TWL LRG LVL3 (GOWN DISPOSABLE)
GOWN STRL REUS W/TWL XL LVL3 (GOWN DISPOSABLE) ×8
KIT BASIN OR (CUSTOM PROCEDURE TRAY) ×3 IMPLANT
KIT ROOM TURNOVER OR (KITS) ×3 IMPLANT
NEEDLE HYPO 22GX1.5 SAFETY (NEEDLE) ×3 IMPLANT
NEEDLE SPNL 22GX3.5 QUINCKE BK (NEEDLE) ×3 IMPLANT
NS IRRIG 1000ML POUR BTL (IV SOLUTION) ×3 IMPLANT
PACK LAMINECTOMY NEURO (CUSTOM PROCEDURE TRAY) ×3 IMPLANT
PAD ARMBOARD 7.5X6 YLW CONV (MISCELLANEOUS) ×9 IMPLANT
RUBBERBAND STERILE (MISCELLANEOUS) ×6 IMPLANT
SPONGE SURGIFOAM ABS GEL SZ50 (HEMOSTASIS) ×3 IMPLANT
STRIP CLOSURE SKIN 1/2X4 (GAUZE/BANDAGES/DRESSINGS) ×2 IMPLANT
SUT VIC AB 2-0 CT1 18 (SUTURE) ×3 IMPLANT
SUT VIC AB 3-0 SH 8-18 (SUTURE) ×3 IMPLANT
SYR 20ML ECCENTRIC (SYRINGE) ×3 IMPLANT
TOWEL OR 17X24 6PK STRL BLUE (TOWEL DISPOSABLE) ×3 IMPLANT
TOWEL OR 17X26 10 PK STRL BLUE (TOWEL DISPOSABLE) ×3 IMPLANT
WATER STERILE IRR 1000ML POUR (IV SOLUTION) ×3 IMPLANT

## 2014-04-19 NOTE — Op Note (Signed)
Date of procedure: 04/19/2014  Date of dictation: Same  Service: Neurosurgery  Preoperative diagnosis: Left L4-5 para central disc herniation with radiculopathy  Postoperative diagnosis: Same  Procedure Name: Left L4-5 laminotomy and microdiscectomy  Surgeon:Madisin Hasan A.Jeneva Schweizer, M.D.  Asst. Surgeon: Franky Macho  Anesthesia: General  Indication: 23 year old male with severe back and bilateral lower extremity pain left greater than right. Workup demonstrates evidence of a very large left-sided L4-5 disc herniation with severe compression the thecal sac and L5 nerve roots left greater than right. Patient's failed conservative management. Patient has transitional anatomy at the lumbosacral junction and this disc herniation has been described differently throughout the chart. He'll be referred to as L4-5 for this dictation. Alternately it is been referred to as L5-S1 and L4-S1.  Operative note: After induction anesthesia, patient positioned prone onto Wilson frame and appropriate padded. Lumbar region prepped and draped. Incision made overlying L4-5. Supper off dissection performed the left side. Retractor placed. X-ray taken. Level confirmed. Laminotomy performed using high-speed drill and Kerrison rongeurs to remove the inferior aspect of lamina of L4 medial aspect the L4-5 facet joint superior rim of the L5 lamina. Ligament flavum was elevated and resected so fashion using Kerrison rongeurs. Underlying thecal sac and L5 nerve or were notified. Microscope and brought into the field and used for microdissection of the spinal canal. Epidural venous plexus was quite related and cut. Thecal sac and L5 nerve root a mobilized. A large disc herniation was readily apparent. The annulus was then incised. Disc material including multiple very large fragments were removed using pituitary rongeurs. An aggressive discectomy was then performed. All elements the disc herniation appeared and resected. All loose or obvious he  jammed his her was removed and interspace. This point a very thorough discectomy been achieved. There was no evidence of injury to thecal sac and nerve roots. Wound is then irrigated out like solution. Gelfoam was placed topically for hemostasis. Microscope and retractor system were removed. Hemostasis of the muscle was achieved with electrocautery. Wounds and close in layers with Vicryl sutures. Steri-Strips sterile dressing were applied. No apparent complications. Patient tolerated the procedure well and he returns to the recovery room postop.

## 2014-04-19 NOTE — Anesthesia Preprocedure Evaluation (Addendum)
Anesthesia Evaluation  Patient identified by MRN, date of birth, ID band Patient awake    Reviewed: Allergy & Precautions, H&P , NPO status , Patient's Chart, lab work & pertinent test results  Airway Mallampati: II TM Distance: >3 FB Neck ROM: Full    Dental  (+) Poor Dentition, Dental Advisory Given   Pulmonary Current Smoker,    Pulmonary exam normal       Cardiovascular hypertension,     Neuro/Psych negative neurological ROS  negative psych ROS   GI/Hepatic Neg liver ROS, GERD-  Medicated,  Endo/Other  negative endocrine ROS  Renal/GU negative Renal ROS  negative genitourinary   Musculoskeletal negative musculoskeletal ROS (+)   Abdominal   Peds negative pediatric ROS (+)  Hematology negative hematology ROS (+)   Anesthesia Other Findings   Reproductive/Obstetrics negative OB ROS                          Anesthesia Physical Anesthesia Plan  ASA: II  Anesthesia Plan: General   Post-op Pain Management:    Induction: Intravenous  Airway Management Planned: Oral ETT  Additional Equipment:   Intra-op Plan:   Post-operative Plan: Extubation in OR  Informed Consent: I have reviewed the patients History and Physical, chart, labs and discussed the procedure including the risks, benefits and alternatives for the proposed anesthesia with the patient or authorized representative who has indicated his/her understanding and acceptance.   Dental advisory given and Dental Advisory Given  Plan Discussed with: CRNA, Anesthesiologist and Surgeon  Anesthesia Plan Comments:        Anesthesia Quick Evaluation

## 2014-04-19 NOTE — Anesthesia Postprocedure Evaluation (Signed)
Anesthesia Post Note  Patient: Daniel Stark  Procedure(s) Performed: Procedure(s) (LRB): LUMBAR LAMINECTOMY/DECOMPRESSION MICRODISCECTOMY LUMBAR LEVEL FIVE, SACRAL ONE (Left)  Anesthesia type: general  Patient location: PACU  Post pain: Pain level controlled  Post assessment: Patient's Cardiovascular Status Stable  Last Vitals:  Filed Vitals:   04/19/14 1145  BP: 110/68  Pulse: 81  Temp:   Resp: 17    Post vital signs: Reviewed and stable  Level of consciousness: sedated  Complications: No apparent anesthesia complications

## 2014-04-19 NOTE — Transfer of Care (Signed)
Immediate Anesthesia Transfer of Care Note  Patient: Daniel Stark  Procedure(s) Performed: Procedure(s): LUMBAR LAMINECTOMY/DECOMPRESSION MICRODISCECTOMY LUMBAR LEVEL FIVE, SACRAL ONE (Left)  Patient Location: PACU  Anesthesia Type:General  Level of Consciousness: awake, alert  and oriented  Airway & Oxygen Therapy: Patient Spontanous Breathing and Patient connected to face mask oxygen  Post-op Assessment: Report given to PACU RN, Post -op Vital signs reviewed and stable and Patient moving all extremities X 4  Post vital signs: Reviewed and stable  Complications: No apparent anesthesia complications

## 2014-04-19 NOTE — Brief Op Note (Signed)
04/19/2014  11:00 AM  PATIENT:  Daniel Stark  23 y.o. male  PRE-OPERATIVE DIAGNOSIS:  HNP  POST-OPERATIVE DIAGNOSIS:  HNP  PROCEDURE:  Procedure(s): LUMBAR LAMINECTOMY/DECOMPRESSION MICRODISCECTOMY LUMBAR LEVEL FIVE, SACRAL ONE (Left)  SURGEON:  Surgeon(s) and Role:    * Temple Pacini, MD - Primary    * Coletta Memos, MD - Assisting  PHYSICIAN ASSISTANT:   ASSISTANTS:    ANESTHESIA:   general  EBL:  Total I/O In: 1500 [I.V.:1500] Out: 100 [Blood:100]  BLOOD ADMINISTERED:none  DRAINS: none   LOCAL MEDICATIONS USED:  MARCAINE     SPECIMEN:  No Specimen  DISPOSITION OF SPECIMEN:  N/A  COUNTS:  YES  TOURNIQUET:  * No tourniquets in log *  DICTATION: .Dragon Dictation  PLAN OF CARE: Admit for overnight observation  PATIENT DISPOSITION:  PACU - hemodynamically stable.   Delay start of Pharmacological VTE agent (>24hrs) due to surgical blood loss or risk of bleeding: yes

## 2014-04-19 NOTE — H&P (Signed)
Daniel Stark is an 23 y.o. male.   Chief Complaint: Back pain HPI: 23 year old male with severe lumbar pain radiating into both lower extremities left greater than right. Workup demonstrates evidence of a large left paracentral disc herniation L4-5 with marked compression the thecal sac and nerve roots. Patient's failed conservative management and presents now for lumbar microdiscectomy in hopes of improving his symptoms.  Past Medical History  Diagnosis Date  . History of kidney stones   . GERD (gastroesophageal reflux disease)     occ  . Hypertension     blood pressure fluxuates up sometimes no meds    Past Surgical History  Procedure Laterality Date  . Appendectomy      child    History reviewed. No pertinent family history. Social History:  reports that he has been smoking Cigarettes.  He has a 9 pack-year smoking history. He does not have any smokeless tobacco history on file. He reports that he does not drink alcohol or use illicit drugs.  Allergies: No Known Allergies  Medications Prior to Admission  Medication Sig Dispense Refill  . oxyCODONE-acetaminophen (PERCOCET) 10-325 MG per tablet Take 1 tablet by mouth every 4 (four) hours as needed for pain.        No results found for this or any previous visit (from the past 48 hour(s)). No results found.  Review of Systems  Constitutional: Negative.   HENT: Negative.   Eyes: Negative.   Respiratory: Negative.   Cardiovascular: Negative.   Gastrointestinal: Negative.   Genitourinary: Negative.   Musculoskeletal: Negative.   Skin: Negative.   Neurological: Negative.   Endo/Heme/Allergies: Negative.   Psychiatric/Behavioral: Negative.     Blood pressure 131/50, pulse 81, temperature 96.7 F (35.9 C), temperature source Oral, resp. rate 20, height 5' 6.5" (1.689 m), weight 124.172 kg (273 lb 12 oz), SpO2 96.00%. Physical Exam  Constitutional: He is oriented to person, place, and time. He appears well-developed and  well-nourished. No distress.  HENT:  Head: Normocephalic and atraumatic.  Right Ear: External ear normal.  Left Ear: External ear normal.  Nose: Nose normal.  Mouth/Throat: Oropharynx is clear and moist. No oropharyngeal exudate.  Eyes: Conjunctivae and EOM are normal. Pupils are equal, round, and reactive to light.  Neck: Normal range of motion. Neck supple. No tracheal deviation present. No thyromegaly present.  Cardiovascular: Normal rate, regular rhythm, normal heart sounds and intact distal pulses.  Exam reveals no friction rub.   No murmur heard. Respiratory: Effort normal and breath sounds normal. No respiratory distress. He has no wheezes.  GI: Soft. Bowel sounds are normal. He exhibits no distension. There is no tenderness.  Musculoskeletal: Normal range of motion. He exhibits no edema and no tenderness.  Neurological: He is alert and oriented to person, place, and time. He has normal reflexes. No cranial nerve deficit. Coordination normal.  Skin: Skin is warm and dry. No rash noted. He is not diaphoretic. No erythema.  Psychiatric: He has a normal mood and affect. His behavior is normal. Judgment and thought content normal.     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 Plan left L4-5 laminotomy and microdiscectomy. Risks and benefits been explained. Patient wishes to proceed.  Daniel Stark A 04/19/2014, 9:22 AM

## 2014-04-19 NOTE — Progress Notes (Signed)
Pt and mother given D/C instructions with Rx's, verbal understanding was provided. Pt's IV was removed prior to D/C. Pt's incision was covered with Honeycomb dressing, incision has no sign of infection. Pt D/C'd home via wheelchair @ 1910 per MD order. Pt is stable @ D/C and has no other needs at this time. Rema Fendt, RN

## 2014-04-19 NOTE — Discharge Summary (Signed)
Physician Discharge Summary  Patient ID: DEVEION DENZ MRN: [id number] DOB/AGE: October 30, 1990 23 y.o.  Admit date: 04/19/2014 Discharge date: 04/19/2014  Admission Diagnoses:  Discharge Diagnoses:  Principal Problem:   HNP (herniated nucleus pulposus), lumbar Active Problems:   Lumbar disc herniation with radiculopathy   Discharged Condition: good  Hospital Course: Patient admitted to the hospital where he underwent uncomplicated left-sided L4-5 laminotomy and microdiscectomy. Postoperatively he is doing well. Back and leg pain improved. Up ambulating. Ready for discharge home.  Consults:   Significant Diagnostic Studies:   Treatments:   Discharge Exam: Blood pressure 135/76, pulse 53, temperature 98.4 F (36.9 C), temperature source Oral, resp. rate 18, height 5' 6.5" (1.689 m), weight 124.172 kg (273 lb 12 oz), SpO2 95.00%. Awake and alert. Oriented and appropriate. Motor and sensory function stable. Wound clean and dry. Chest and abdomen benign.  Disposition: 01-Home or Self Care     Medication List         cyclobenzaprine 10 MG tablet  Commonly known as:  FLEXERIL  Take 1 tablet (10 mg total) by mouth 3 (three) times daily as needed for muscle spasms.     oxyCODONE-acetaminophen 10-325 MG per tablet  Commonly known as:  PERCOCET  Take 1-2 tablets by mouth every 4 (four) hours as needed for pain.         Signed: Yakelin Grenier A 04/19/2014, 5:43 PM

## 2014-04-19 NOTE — Discharge Instructions (Signed)
Wound Care Keep incision covered and dry for two days.  If you shower, cover incision with plastic wrap.  Do not put any creams, lotions, or ointments on incision. Leave steri-strips on back.  They will fall off by themselves. Activity Walk each and every day, increasing distance each day. No lifting greater than 5 lbs.  Avoid excessive neck motion. No driving for 2 weeks; may ride as a passenger locally. If provided with back brace, wear when out of bed.  It is not necessary to wear brace in bed. Diet Resume your normal diet.  Return to Work Will be discussed at you follow up appointment. Call Your Doctor If Any of These Occur Redness, drainage, or swelling at the wound.  Temperature greater than 101 degrees. Severe pain not relieved by pain medication. Incision starts to come apart. Follow Up Appt Call today for appointment in 1-2 weeks ([phone]) or for problems.  If you have any hardware placed in your spine, you will need an x-ray before your appointment.  Laminectomy - Laminotomy - Discectomy Your surgeon has decided that a laminectomy (entire lamina removal) or laminotomy (partial lamina removal) is the best treatment for your back problem. These procedures involve removal of bone to relieve pressure on nerve roots. It allows the surgeon access to parts of the spine where other problems are located. This could be an injured disc (the cartilage-like structures located between the bones of the back). In this surgery your surgeon removes a part of the boney arch that surrounds your spinal canal. This may be compressing nerve roots. In some cases, the surgeon will remove the disc and fuse (stick together) vertebral bodies (the bones of your back) to make the spine more stable. The type of procedure you will need is usually decided prior to surgery, however modifications may be necessary. The time in surgery depends on the findings in surgery and the procedure necessary to correct the  problems. DISCECTOMY For people with disc problems, the surgeon removes the portion of the disc that is causing the pressure on the nerve root. Some surgeons perform a micro (small) discectomy, which may require removal of only a small portion of the lamina. A disc nucleus (center) may also be removed either through a needle (percutaneous discectomy) or by injecting an enzyme called chymopapain into the disc. Chymopapain is an enzyme that dissolves the disc. For people with back instability, the surgeon fuses vertebrae that are next to each other with tiny pieces of bone. These are used as bone grafts on the facets, or between the vertebrae. When this heals, the bones will no longer be able to move. These bone chips are often taken from the pelvic bones. Bones and bone grafts grow into one unit, stabilizing the segments of the spinal column. LET YOUR CAREGIVER KNOW ABOUT:  Allergies.  Medicines taken including herbs, eyedrops, over-the-counter medicines, and creams.  Use of steroids (by mouth or creams).  Previous problems with anesthetics or numbing medicine.  Possibility of pregnancy, if this applies.  History of blood clots (thrombophlebitis).  History of bleeding or blood problems.  Previous surgery.  Other health problems. RISKS AND COMPLICATIONS Your caregiver will discuss possible risks and complications with you before surgery. In addition to the usual risks of anesthesia, other common risks and complications include:  Blood loss and replacement.  Temporary increase in pain due to surgery.  Uncorrected back pain.  Infection.  New nerve damage (tingling, numbness, and pain). BEFORE THE PROCEDURE  Stop smoking at  least 1 week prior to surgery. This lowers risk during surgery.  Your caregiver may advise that you stop taking certain medicines that may affect the outcome of the surgery and your ability to heal. For example, you may need to stop taking anti-inflammatories,  such as aspirin, because of possible bleeding problems. Other medicines may have interactions with anesthesia.  Tell your caregiver if you have been on steroids for long periods of time. Often, additional steroids are administered intravenously before and during the procedure to prevent complications.  You should be present 60 minutes prior to your procedure or as directed. AFTER THE PROCEDURE After surgery, you will be taken to the recovery area where a nurse will watch and check your progress. Generally, you will be allowed to go home within 1 week barring other problems. HOME CARE INSTRUCTIONS   Check the surgical cut (incision) twice a day for signs of infection. Some signs may include a bad smelling, greenish or yellowish discharge from the wound; increased pain or increased redness over the incision site; an opening of the incision; flu-like symptoms; or a temperature above 101.5 F (38.6 C).  Change your bandages in about 24 to 36 hours following surgery or as directed.  You may shower once the bandage is removed or as directed. Avoid bathtubs, swimming pools, and hot tubs for 3 weeks or until your incision has healed completely. If you have stitches (sutures) or staples they may be removed 2 to 3 weeks after surgery, or as directed by your caregiver.  Follow your caregiver's instructions for activities, exercises, and physical therapy.  Weight reduction may be beneficial if you are overweight.  Walking is permitted. You may use a treadmill without an incline. Cut down on activities if you have discomfort. You may also go up and down stairs as tolerated.  Do not lift anything heavier than 10 to 15 pounds. Avoid bending or twisting at the waist. Always bend your knees.  Maintain strength and range of motion as instructed.  No driving is permitted for 2 to 3 weeks, or as directed by your caregiver. You may be a passenger for 20 to 30 minute trips. Lying back in the passenger seat may  be more comfortable for you.  Limit your sitting to 20 to 30 minute intervals. You should lie down or walk in between sitting periods. There are no limitations for sitting in a recliner chair.  Only take over-the-counter or prescription medicines for pain, discomfort, or fever as directed by your caregiver. SEEK MEDICAL CARE IF:   There is increased bleeding (more than a small spot) from the wound.  You notice redness, swelling, or increasing pain in the wound.  Pus is coming from the wound.  An unexplained oral temperature above 102 F (38.9 C) develops.  You notice a bad smell coming from the wound or dressing. SEEK IMMEDIATE MEDICAL CARE IF:   You develop a rash.  You have difficulty breathing.  You have any allergic problems. Document Released: 08/09/2000 Document Revised: 12/27/2013 Document Reviewed: 06/07/2008 [facility] Patient Information 2015 [place], [place]. This information is not intended to replace advice given to you by your health care provider. Make sure you discuss any questions you have with your health care provider.    Wound Care Keep incision covered and dry for one week.  If you shower prior to then, cover incision with plastic wrap.  You may remove outer bandage after one week and shower.  Do not put any creams, lotions, or ointments  on incision. Leave steri-strips on neck.  They will fall off by themselves. Activity Walk each and every day, increasing distance each day. No lifting greater than 5 lbs.  Avoid excessive neck motion. No driving for 2 weeks; may ride as a passenger locally. If provided with back brace, wear when out of bed.  It is not necessary to wear brace in bed. Diet Resume your normal diet.  Return to Work Will be discussed at you follow up appointment. Call Your Doctor If Any of These Occur Redness, drainage, or swelling at the wound.  Temperature greater than 101 degrees. Severe pain not relieved by pain medication. Incision  starts to come apart. Follow Up Appt Call today for appointment in 1-2 weeks ([phone]) or for problems.  If you have any hardware placed in your spine, you will need an x-ray before your appointment.

## 2014-04-20 ENCOUNTER — Encounter (HOSPITAL_COMMUNITY): Payer: Self-pay | Admitting: Neurosurgery

## 2014-07-28 ENCOUNTER — Other Ambulatory Visit (HOSPITAL_COMMUNITY): Payer: Self-pay | Admitting: Neurosurgery

## 2014-07-28 ENCOUNTER — Ambulatory Visit (HOSPITAL_COMMUNITY)
Admission: RE | Admit: 2014-07-28 | Discharge: 2014-07-28 | Disposition: A | Discharge status: Home / Self Care | Payer: Medicaid Other | Source: Ambulatory Visit | Attending: Neurosurgery | Admitting: Neurosurgery

## 2014-07-28 DIAGNOSIS — M7989 Other specified soft tissue disorders: Secondary | ICD-10-CM | POA: Insufficient documentation

## 2014-07-28 NOTE — Progress Notes (Signed)
VASCULAR LAB PRELIMINARY  PRELIMINARY  PRELIMINARY  PRELIMINARY  Bilateral lower extremity venous Dopplers completed.    Preliminary report:  There is no DVT or SVT noted in the bilateral lower extremities.   Zylen Wenig, RVT 07/28/2014, 3:19 PM

## 2014-11-09 ENCOUNTER — Other Ambulatory Visit: Payer: Self-pay

## 2014-11-09 DIAGNOSIS — M79606 Pain in leg, unspecified: Secondary | ICD-10-CM

## 2014-11-09 DIAGNOSIS — M7989 Other specified soft tissue disorders: Secondary | ICD-10-CM

## 2014-11-28 ENCOUNTER — Encounter: Payer: Self-pay | Admitting: Vascular Surgery

## 2014-11-29 ENCOUNTER — Ambulatory Visit (INDEPENDENT_AMBULATORY_CARE_PROVIDER_SITE_OTHER): Payer: Medicaid Other | Admitting: Vascular Surgery

## 2014-11-29 ENCOUNTER — Encounter: Payer: Self-pay | Admitting: Vascular Surgery

## 2014-11-29 ENCOUNTER — Ambulatory Visit (HOSPITAL_COMMUNITY)
Admission: RE | Admit: 2014-11-29 | Discharge: 2014-11-29 | Disposition: A | Discharge status: Home / Self Care | Payer: Medicaid Other | Source: Ambulatory Visit | Attending: Vascular Surgery | Admitting: Vascular Surgery

## 2014-11-29 ENCOUNTER — Ambulatory Visit (INDEPENDENT_AMBULATORY_CARE_PROVIDER_SITE_OTHER)
Admission: RE | Admit: 2014-11-29 | Discharge: 2014-11-29 | Disposition: A | Discharge status: Home / Self Care | Payer: Medicaid Other | Source: Ambulatory Visit | Attending: Vascular Surgery | Admitting: Vascular Surgery

## 2014-11-29 VITALS — BP 133/81 | HR 105 | Resp 14 | Ht 68.0 in | Wt 257.0 lb

## 2014-11-29 DIAGNOSIS — M79605 Pain in left leg: Secondary | ICD-10-CM | POA: Diagnosis not present

## 2014-11-29 DIAGNOSIS — M79604 Pain in right leg: Secondary | ICD-10-CM

## 2014-11-29 DIAGNOSIS — M7989 Other specified soft tissue disorders: Secondary | ICD-10-CM

## 2014-11-29 DIAGNOSIS — M79606 Pain in leg, unspecified: Secondary | ICD-10-CM

## 2014-11-29 NOTE — Progress Notes (Signed)
Subjective:     Patient ID: Daniel Stark, male   DOB: [birth year], 24 y.o.   MRN: [id number]  HPI this 24 year old male was referred for bilateral leg pain and swelling. This apparently has been going on for a few years dressing each year. Patient has a history of a radiculopathy with herniated lumbar disc and surgery by Dr. Dutch Quint in August 2015. He had severe pain radiating down the left leg preoperatively. That has improved but not resolved. He continues to have discomfort which he describes as aching from the knees distally and edema which progresses during the day. He does not were elastic compression stockings or elevate his legs. He has no history of DVT, thrombophlebitis, stasis ulcers, or bleeding. He had a negative ultrasound of his veins in January 2016 with no DVT noted. He does have a family history strongly positive for PAD.  Past Medical History  Diagnosis Date  . History of kidney stones   . GERD (gastroesophageal reflux disease)     occ  . Hypertension     blood pressure fluxuates up sometimes no meds    History  Substance Use Topics  . Smoking status: Current Every Day Smoker -- 1.00 packs/day for 9 years    Types: Cigarettes  . Smokeless tobacco: Never Used  . Alcohol Use: No    Family History  Problem Relation Age of Onset  . COPD Mother   . Heart disease Father     No Known Allergies   Current outpatient prescriptions:  .  cyclobenzaprine (FLEXERIL) 10 MG tablet, Take 1 tablet (10 mg total) by mouth 3 (three) times daily as needed for muscle spasms., Disp: 30 tablet, Rfl: 0 .  oxyCODONE-acetaminophen (PERCOCET) 10-325 MG per tablet, Take 1-2 tablets by mouth every 4 (four) hours as needed for pain., Disp: 80 tablet, Rfl: 0  Filed Vitals:   11/29/14 1320  BP: 133/81  Pulse: 105  Resp: 14  Height:  (1.727 m)  Weight: 257 lb (116.574 kg)    Body mass index is 39.09 kg/(m^2).        \   Review of Systems has history of GERD. Occasional chest  discomfort due to anxiety according to mother. Has persistent discomfort down the left leg more than the right since 2 years ago slightly improved by back surgery area     Objective:   Physical Exam BP 133/81 mmHg  Pulse 105  Resp 14  Ht  (1.727 m)  Wt 257 lb (116.574 kg)  BMI 39.09 kg/m2  Gen.-alert and oriented x3 in no apparent distress HEENT normal for age Lungs no rhonchi or wheezing Cardiovascular regular rhythm no murmurs carotid pulses 3+ palpable no bruits audible Abdomen soft nontender no palpable masses Musculoskeletal free of  major deformities Skin clear -no rashes Neurologic normal Lower extremities 3+ femoral and dorsalis pedis pulses palpable bilaterally with minimal edema bilaterally. No evidence of bulging varicosities or spider veins or chronic venous insufficiency.  Today I ordered arterial study and a venous study of both lower extremities to help delineate the problem. Arterial study was normal with triphasic flow and normal ABIs. Venous study revealed no DVT and no superficial venous reflux.       Assessment:     Bilateral leg pain and swelling with history of left lumbar radiculopathy and previous laminectomy performed 2015 by Dr. Dutch Quint No evidence of arterial or venous insufficiency to account for symptoms    Plan:     Recommend elevate foot  of bed 3-4 inches to help minimize edema. Should also elevate foot of bed intermittently during day as patient does not ambulate much If swelling persists patient should have short leg elastic compression stockings 20-30 millimeter gradient place on the legs after awakening in the a.m. No further suggestions-no evidence of arterial venous insufficiency to account for his symptoms

## 2015-11-10 IMAGING — CR DG LUMBAR SPINE 1V
1 series · 1 of 1 positions shown · non-contrast
Comparison: Lumbar MRI, 06/28/2013

CLINICAL DATA: Portable lateral lumbar spine imaging for surgical
localization.

EXAM:
LUMBAR SPINE - 1 VIEW

[lat]
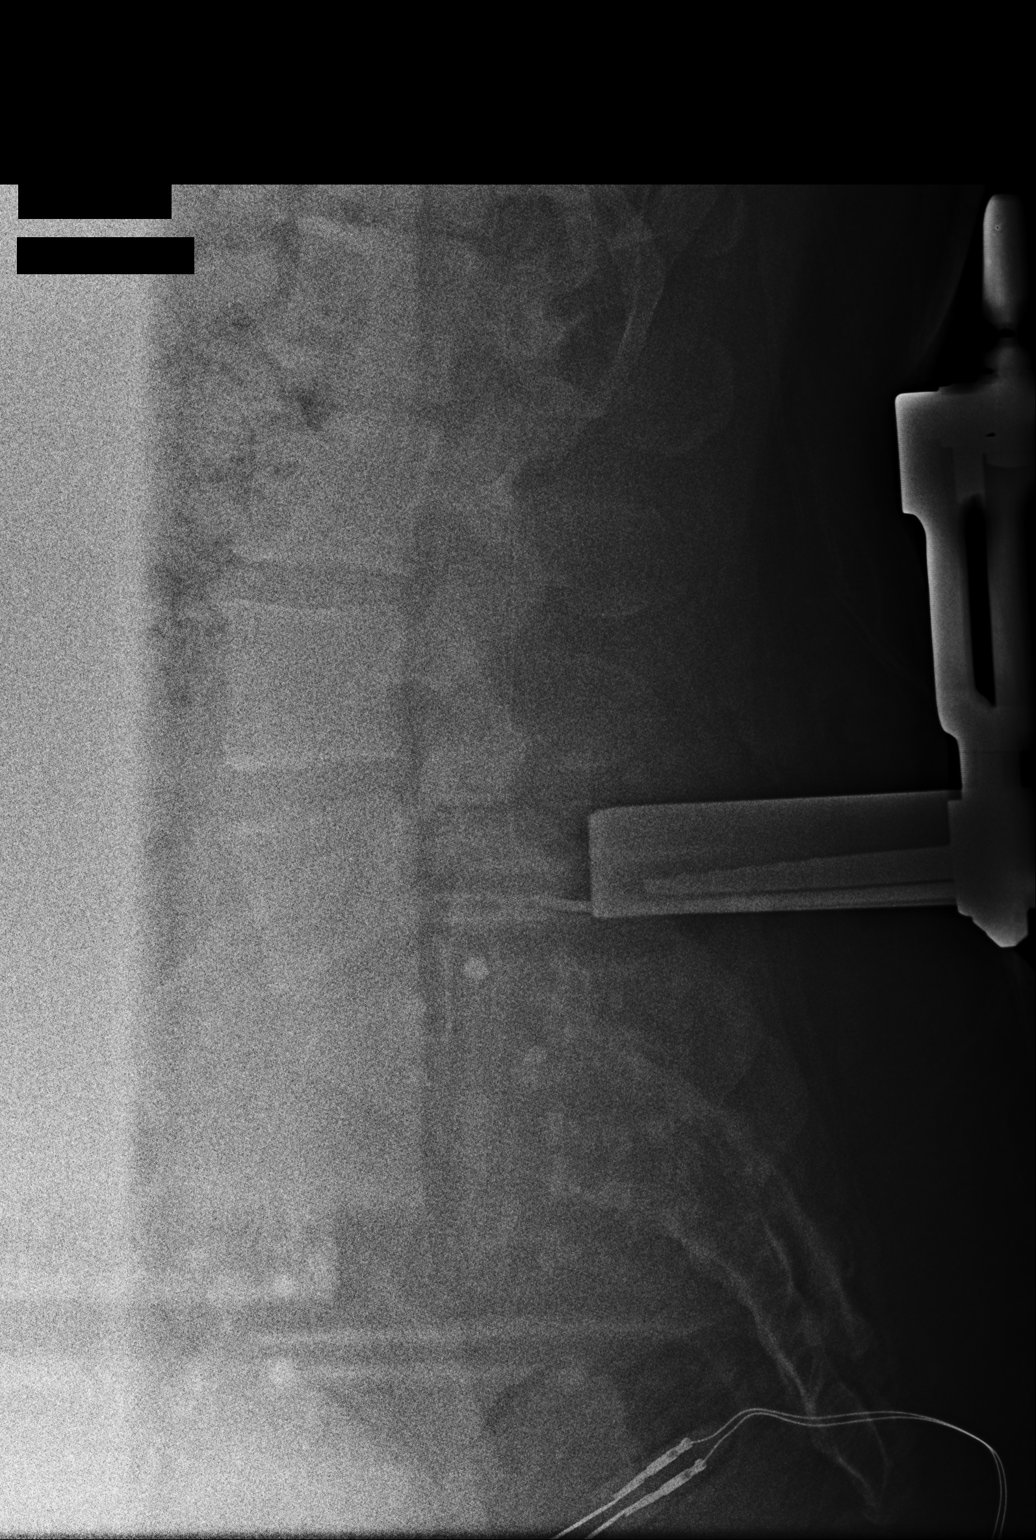

[1 of 1 positions shown; findings below may reference images not displayed]

FINDINGS: Single lateral image shows placement of a surgical instrument from a
posterior approach through skin retractors. The tip lies posterior
to the posterior lower endplate of L5.
IMPRESSION: Surgical localization image as described.

## 2019-08-11 ENCOUNTER — Encounter (HOSPITAL_COMMUNITY): Payer: Self-pay

## 2019-09-08 ENCOUNTER — Encounter (HOSPITAL_COMMUNITY): Payer: Self-pay

## 2019-10-07 ENCOUNTER — Other Ambulatory Visit: Payer: Self-pay

## 2019-10-07 DIAGNOSIS — I83893 Varicose veins of bilateral lower extremities with other complications: Secondary | ICD-10-CM

## 2019-10-08 ENCOUNTER — Encounter (HOSPITAL_COMMUNITY): Payer: Self-pay
